# Patient Record
Sex: Male | Born: 1980 | Race: White | Hispanic: No | Marital: Married | Smoking: Current every day smoker
Health system: Southern US, Community
[De-identification: ages and names within clinical notes are randomized; demographics above are authoritative.]

## PROBLEM LIST (undated history)

## (undated) DIAGNOSIS — R569 Unspecified convulsions: Secondary | ICD-10-CM

## (undated) DIAGNOSIS — S065XAA Traumatic subdural hemorrhage with loss of consciousness status unknown, initial encounter: Secondary | ICD-10-CM

## (undated) DIAGNOSIS — S069XAA Unspecified intracranial injury with loss of consciousness status unknown, initial encounter: Secondary | ICD-10-CM

## (undated) DIAGNOSIS — S065X9A Traumatic subdural hemorrhage with loss of consciousness of unspecified duration, initial encounter: Secondary | ICD-10-CM

## (undated) DIAGNOSIS — S069X9A Unspecified intracranial injury with loss of consciousness of unspecified duration, initial encounter: Secondary | ICD-10-CM

## (undated) HISTORY — PX: BURR HOLE FOR SUBDURAL HEMATOMA: SHX1275

---

## 2018-10-27 ENCOUNTER — Emergency Department (HOSPITAL_COMMUNITY): Payer: No Typology Code available for payment source

## 2018-10-27 ENCOUNTER — Encounter (HOSPITAL_COMMUNITY): Payer: Self-pay | Admitting: Emergency Medicine

## 2018-10-27 ENCOUNTER — Other Ambulatory Visit: Payer: Self-pay

## 2018-10-27 ENCOUNTER — Inpatient Hospital Stay (HOSPITAL_COMMUNITY)
Admission: EM | Admit: 2018-10-27 | Discharge: 2018-10-29 | DRG: 101 | Disposition: A | Discharge status: Home / Self Care | Payer: No Typology Code available for payment source | Attending: Internal Medicine | Admitting: Internal Medicine

## 2018-10-27 DIAGNOSIS — Z8249 Family history of ischemic heart disease and other diseases of the circulatory system: Secondary | ICD-10-CM

## 2018-10-27 DIAGNOSIS — R569 Unspecified convulsions: Secondary | ICD-10-CM

## 2018-10-27 DIAGNOSIS — Z781 Physical restraint status: Secondary | ICD-10-CM

## 2018-10-27 DIAGNOSIS — F15950 Other stimulant use, unspecified with stimulant-induced psychotic disorder with delusions: Secondary | ICD-10-CM | POA: Diagnosis not present

## 2018-10-27 DIAGNOSIS — G40409 Other generalized epilepsy and epileptic syndromes, not intractable, without status epilepticus: Secondary | ICD-10-CM | POA: Diagnosis not present

## 2018-10-27 DIAGNOSIS — I10 Essential (primary) hypertension: Secondary | ICD-10-CM | POA: Diagnosis not present

## 2018-10-27 DIAGNOSIS — Z20828 Contact with and (suspected) exposure to other viral communicable diseases: Secondary | ICD-10-CM | POA: Diagnosis not present

## 2018-10-27 DIAGNOSIS — Z87820 Personal history of traumatic brain injury: Secondary | ICD-10-CM | POA: Diagnosis not present

## 2018-10-27 DIAGNOSIS — F1210 Cannabis abuse, uncomplicated: Secondary | ICD-10-CM | POA: Diagnosis not present

## 2018-10-27 DIAGNOSIS — Z885 Allergy status to narcotic agent status: Secondary | ICD-10-CM | POA: Diagnosis not present

## 2018-10-27 DIAGNOSIS — Z720 Tobacco use: Secondary | ICD-10-CM | POA: Diagnosis present

## 2018-10-27 DIAGNOSIS — F17210 Nicotine dependence, cigarettes, uncomplicated: Secondary | ICD-10-CM | POA: Diagnosis not present

## 2018-10-27 DIAGNOSIS — R059 Cough, unspecified: Secondary | ICD-10-CM

## 2018-10-27 DIAGNOSIS — R05 Cough: Secondary | ICD-10-CM

## 2018-10-27 DIAGNOSIS — Z880 Allergy status to penicillin: Secondary | ICD-10-CM

## 2018-10-27 HISTORY — DX: Traumatic subdural hemorrhage with loss of consciousness of unspecified duration, initial encounter: S06.5X9A

## 2018-10-27 HISTORY — DX: Unspecified convulsions: R56.9

## 2018-10-27 HISTORY — DX: Unspecified intracranial injury with loss of consciousness status unknown, initial encounter: S06.9XAA

## 2018-10-27 HISTORY — DX: Unspecified intracranial injury with loss of consciousness of unspecified duration, initial encounter: S06.9X9A

## 2018-10-27 HISTORY — DX: Traumatic subdural hemorrhage with loss of consciousness status unknown, initial encounter: S06.5XAA

## 2018-10-27 LAB — VITAMIN B12: Vitamin B-12: 246 pg/mL (ref 180–914)

## 2018-10-27 LAB — BASIC METABOLIC PANEL
Anion gap: 10 (ref 5–15)
BUN: 12 mg/dL (ref 6–20)
CO2: 23 mmol/L (ref 22–32)
Calcium: 9.1 mg/dL (ref 8.9–10.3)
Chloride: 105 mmol/L (ref 98–111)
Creatinine, Ser: 0.9 mg/dL (ref 0.61–1.24)
GFR calc Af Amer: >60 mL/min (ref >60)
GFR calc non Af Amer: >60 mL/min (ref >60)
Glucose, Bld: 84 mg/dL (ref 70–99)
Potassium: 3.9 mmol/L (ref 3.5–5.1)
Sodium: 138 mmol/L (ref 135–145)

## 2018-10-27 LAB — URINALYSIS, ROUTINE W REFLEX MICROSCOPIC
Bacteria, UA: NONE SEEN
Bilirubin Urine: NEGATIVE
Glucose, UA: NEGATIVE mg/dL
Ketones, ur: NEGATIVE mg/dL
Leukocytes,Ua: NEGATIVE
Nitrite: NEGATIVE
Protein, ur: NEGATIVE mg/dL
Specific Gravity, Urine: 1.006 (ref 1.005–1.030)
pH: 6 (ref 5.0–8.0)

## 2018-10-27 LAB — ETHANOL: Alcohol, Ethyl (B): <10 mg/dL (ref <10)

## 2018-10-27 LAB — CBC
HCT: 44.1 % (ref 39.0–52.0)
Hemoglobin: 14.9 g/dL (ref 13.0–17.0)
MCH: 29.8 pg (ref 26.0–34.0)
MCHC: 33.8 g/dL (ref 30.0–36.0)
MCV: 88.2 fL (ref 80.0–100.0)
Platelets: 210 10*3/uL (ref 150–400)
RBC: 5 MIL/uL (ref 4.22–5.81)
RDW: 11.9 % (ref 11.5–15.5)
WBC: 7.2 10*3/uL (ref 4.0–10.5)
nRBC: 0 % (ref 0.0–0.2)

## 2018-10-27 LAB — HEPATIC FUNCTION PANEL
ALT: 14 U/L (ref 0–44)
AST: 24 U/L (ref 15–41)
Albumin: 4.1 g/dL (ref 3.5–5.0)
Alkaline Phosphatase: 52 U/L (ref 38–126)
Bilirubin, Direct: 0.1 mg/dL (ref 0.0–0.2)
Indirect Bilirubin: 0.7 mg/dL (ref 0.3–0.9)
Total Bilirubin: 0.8 mg/dL (ref 0.3–1.2)
Total Protein: 7.3 g/dL (ref 6.5–8.1)

## 2018-10-27 LAB — VALPROIC ACID LEVEL: Valproic Acid Lvl: <10 ug/mL — ABNORMAL LOW (ref 50.0–100.0)

## 2018-10-27 LAB — TSH: TSH: 0.616 u[IU]/mL (ref 0.350–4.500)

## 2018-10-27 LAB — RAPID URINE DRUG SCREEN, HOSP PERFORMED
Amphetamines: POSITIVE — AB
Barbiturates: NOT DETECTED
Benzodiazepines: POSITIVE — AB
Cocaine: NOT DETECTED
Opiates: NOT DETECTED
Tetrahydrocannabinol: POSITIVE — AB

## 2018-10-27 LAB — SARS CORONAVIRUS 2 BY RT PCR (HOSPITAL ORDER, PERFORMED IN ~~LOC~~ HOSPITAL LAB): SARS Coronavirus 2: NEGATIVE

## 2018-10-27 LAB — CARBAMAZEPINE LEVEL, TOTAL: Carbamazepine Lvl: <2 ug/mL — ABNORMAL LOW (ref 4.0–12.0)

## 2018-10-27 LAB — AMMONIA: Ammonia: 51 umol/L — ABNORMAL HIGH (ref 9–35)

## 2018-10-27 LAB — PHENYTOIN LEVEL, TOTAL: Phenytoin Lvl: <2.5 ug/mL — ABNORMAL LOW (ref 10.0–20.0)

## 2018-10-27 LAB — SEDIMENTATION RATE: Sed Rate: 2 mm/hr (ref 0–16)

## 2018-10-27 MED ORDER — NICOTINE 21 MG/24HR TD PT24: 21.0000 mg | MEDICATED_PATCH | Freq: Every day | TRANSDERMAL | Status: DC | PRN

## 2018-10-27 MED ORDER — LORAZEPAM 2 MG/ML IJ SOLN: 1.0000 mg | Freq: Four times a day (QID) | INTRAMUSCULAR | Status: DC | PRN

## 2018-10-27 MED ORDER — SODIUM CHLORIDE 0.9 % IV SOLN
INTRAVENOUS | Status: DC
  Administered 2018-10-27: 20:00:00 via INTRAVENOUS

## 2018-10-27 MED ORDER — ACETAMINOPHEN 325 MG PO TABS: 650.0000 mg | ORAL_TABLET | Freq: Four times a day (QID) | ORAL | Status: DC | PRN

## 2018-10-27 MED ORDER — LEVETIRACETAM IN NACL 500 MG/100ML IV SOLN
500.0000 mg | Freq: Two times a day (BID) | INTRAVENOUS | Status: DC
  Administered 2018-10-28: 500 mg via INTRAVENOUS
  Filled 2018-10-27 (×6): qty 100

## 2018-10-27 MED ORDER — LEVETIRACETAM IN NACL 1000 MG/100ML IV SOLN
1000.0000 mg | Freq: Once | INTRAVENOUS | Status: AC
  Administered 2018-10-27: 1000 mg via INTRAVENOUS
  Filled 2018-10-27: qty 100

## 2018-10-27 MED ORDER — ACETAMINOPHEN 650 MG RE SUPP: 650.0000 mg | Freq: Four times a day (QID) | RECTAL | Status: DC | PRN

## 2018-10-27 NOTE — ED Provider Notes (Signed)
[facility] EMERGENCY DEPARTMENT Provider Note   CSN: [id number] Arrival date & time: 10/27/18  1402    History   Chief Complaint Chief Complaint  Patient presents with  . Seizures    HPI Nathan Mccarty is a 38 y.o. male.     Patient is a 38 year old male with past medical history of bilateral subdural hematomas years ago with subsequent seizure disorder.  He presents today for evaluation of seizure activity and unresponsiveness.  The patient was having his dog euthanized.  While he was at the veterinary office, he apparently experienced a 10-minute episode of generalized shaking felt to be a seizure.  He remains minimally responsive and postictal.  He adds no additional history secondary to unresponsiveness and acuity of condition.  The history is provided by the patient.  Seizures  Seizure type:  Grand mal Initial focality:  None Episode characteristics: confusion and disorientation   Severity:  Severe Duration:  10 minutes Timing:  Once Progression:  Partially resolved   Past Medical History:  Diagnosis Date  . Seizures (HCC)     There are no active problems to display for this patient.   History reviewed. No pertinent surgical history.      Home Medications    Prior to Admission medications   Not on File    Family History No family history on file.  Social History Social History   Tobacco Use  . Smoking status: Never Smoker  . Smokeless tobacco: Never Used  Substance Use Topics  . Alcohol use: Never    Frequency: Never  . Drug use: Never     Allergies   Patient has no allergy information on record.   Review of Systems Review of Systems  Unable to perform ROS: Acuity of condition  Neurological: Positive for seizures.     Physical Exam Updated Vital Signs BP (!) 143/108   Pulse 81   Temp 97.7 F (36.5 C) (Oral)   Resp 18   SpO2 100%   Physical Exam Vitals signs and nursing note reviewed.  Constitutional:      General: He  is not in acute distress.    Appearance: He is well-developed. He is not diaphoretic.  HENT:     Head: Normocephalic and atraumatic.  Neck:     Musculoskeletal: Normal range of motion and neck supple.  Cardiovascular:     Rate and Rhythm: Normal rate and regular rhythm.     Heart sounds: No murmur. No friction rub.  Pulmonary:     Effort: Pulmonary effort is normal. No respiratory distress.     Breath sounds: Normal breath sounds. No wheezing or rales.  Abdominal:     General: Bowel sounds are normal. There is no distension.     Palpations: Abdomen is soft.     Tenderness: There is no abdominal tenderness.  Musculoskeletal: Normal range of motion.  Skin:    General: Skin is warm and dry.  Neurological:     Mental Status: He is alert.     Coordination: Coordination normal.     Comments: Patient is somnolent, but will attempt to speak when stimulated with loud voice or noxious stimuli.  He then resumes somnolence.  I have witnessed to move all 4 extremities.  Pupils are 3 mm and slowly reactive with wandering eye movements.      ED Treatments / Results  Labs (all labs ordered are listed, but only abnormal results are displayed) Labs Reviewed  CBC  CARBAMAZEPINE LEVEL, TOTAL  PHENYTOIN LEVEL,  TOTAL  VALPROIC ACID LEVEL  BASIC METABOLIC PANEL    EKG None  Radiology No results found.  Procedures Procedures (including critical care time)  Medications Ordered in ED Medications - No data to display   Initial Impression / Assessment and Plan / ED Course  I have reviewed the triage vital signs and the nursing notes.  Pertinent labs & imaging results that were available during my care of the patient were reviewed by me and considered in my medical decision making (see chart for details).  Patient brought here by EMS after an apparent seizure.  I was told it lasted approximately 10 minutes and patient seems to have a prolonged postictal phase.  His CT scan is unremarkable  and laboratory studies are also unremarkable.  Patient will be admitted to the hospitalist service for observation.  He was given IV Keppra here in the ER.  Final Clinical Impressions(s) / ED Diagnoses   Final diagnoses:  None    ED Discharge Orders    None       Geoffery Lyons, MD 10/27/18 2258

## 2018-10-27 NOTE — ED Notes (Signed)
From CT 

## 2018-10-27 NOTE — ED Notes (Signed)
Call for report 

## 2018-10-27 NOTE — ED Notes (Signed)
Spoke w Dr Judd Lien re pt and conversation with wife

## 2018-10-27 NOTE — H&P (Signed)
TRH H&P    Patient Demographics:    Nathan Mccarty, is a 38 y.o. male  MRN: [id number]  DOB - 03/28/1981  Admit Date - 10/27/2018  Referring MD/NP/PA: Geoffery Lyonsouglas Delo  [facility]patient [facility] as primary care and neurology  Patient coming from: veterinarian office  Chief complaint- seizure   HPI:    Nathan GrosDaniel Mccarty  is a 38 y.o. male,h/o subdural hematoma x2 (nontraumatic ?), seizure do, TBI while in [employer], apparently presents due to generalized seizure.  Per his wife, they had taken his dog to the veterinarian and had witnessed generalized tonic clonic seizure x 10 minutes.  + incontinence.  + postictal state.  Pt has been on dilantin in the past with adverse reaction of hallucination, and also previously on keppra with good results.  Pt's last seizure about 1 year ago according to his wife. Pt is currently not taking any medication.  He states that he is using marijuana to treat his seizures.   In ED,  Pt initially postictal.  Currently axox2 (person, place, but not year),  Pt is improving per nursing staff.  He wasn't even talking before.   + confirmed incontinence.   T 97.7  P 81  R 17  Bp 146/108  Pox 100%  CT brain  IMPRESSION: 1. No acute intracranial abnormality. 2. Resolved right occipital blood since March, negative noncontrast CT appearance of the brain today aside from prior bilateral craniotomies.  Wbc 7.2, hgb 14.9, Plt 210 Na 138, K 3.9 Bun 12, Creatinine 0.9 Glucose 84  Pt given keppra 500mg  iv x1 while in ED, no further witnessed seizures.   Pt will be admitted for seizure, AMS secondary to post ictal state, improving.         Review of systems:    In addition to the HPI above,  No Fever-chills, No Headache, No changes with Vision or hearing, No problems swallowing food or Liquids, No Chest pain, Cough or  Shortness of Breath, No Abdominal pain, No Nausea or Vomiting, bowel movements are regular, No Blood in stool or Urine, No dysuria, No new skin rashes or bruises, No new joints pains-aches,  No new weakness, tingling, numbness in any extremity, No recent weight gain or loss, No polyuria, polydypsia or polyphagia, No significant Mental Stressors.  All other systems reviewed and are negative.    Past History of the following :    Past Medical History:  Diagnosis Date  . Seizures (HCC)   . Subdural hematoma (HCC)   . TBI (traumatic brain injury) [facility](HCC)       Past Surgical History:  Procedure Laterality Date  . BURR HOLE FOR SUBDURAL HEMATOMA        Social History:      Social History   Tobacco Use  . Smoking status: Current Every Day Smoker    Types: Cigarettes  . Smokeless tobacco: Never Used  Substance Use Topics  . Alcohol use: Never    Frequency: Never  Family History :     Family History  Problem Relation Age of Onset  . CAD Father        Home Medications:   Prior to Admission medications   Medication Sig Start Date End Date Taking? Authorizing Provider  gabapentin (NEURONTIN) 300 MG capsule Take 300 mg by mouth 3 (three) times daily.   Yes [provider]     Allergies:     Allergies  Allergen Reactions  . Dilantin [Phenytoin Sodium Extended] Other (See Comments)    Hallucination per wife  . Penicillins     Did it involve swelling of the face/tongue/throat, SOB, or low BP? No Did it involve sudden or severe rash/hives, skin peeling, or any reaction on the inside of your mouth or nose? No Did you need to seek medical attention at a hospital or doctor's office? No When did it last happen? If all above answers are "NO", may proceed with cephalosporin use.   . Topamax [Topiramate]     hallucinations  . Toradol [Ketorolac Tromethamine] Other (See Comments)     Physical Exam:   Vitals  Blood pressure (!) 156/90,  pulse 81, temperature 97.7 F (36.5 C), temperature source Oral, resp. rate 18, SpO2 100 %.  1.  General: axox 2 (person, place)  2. Psychiatric: euthymic  3. Neurologic: Slightly somnolent, cn2-12 intact, reflexes 2+ symmetric, diffuse with no clonus, motor 5/5 in all 4 ext  4. HEENMT:  Anicteric, pupils, 1.57mm symmetric, direct, consensual, near intact No tongue laceration Neck: no jvd, no bruit,   5. Respiratory : CTAB  6. Cardiovascular : rrr s1, s2, no m/g/r  7. Gastrointestinal:  Abd: soft, nt, nd, +bs  8. Skin:  Ext: no c/c/e,  No rash  9.Musculoskeletal:  Good ROM  No adenopathy    Data Review:    CBC Recent Labs  Lab 10/27/18 1510  WBC 7.2  HGB 14.9  HCT 44.1  PLT 210  MCV 88.2  MCH 29.8  MCHC 33.8  RDW 11.9   ------------------------------------------------------------------------------------------------------------------  Results for orders placed or performed during the hospital encounter of 10/27/18 (from the past 48 hour(s))  Carbamazepine (Tegretol) Level (if patient is taking this medication)     Status: Abnormal   Collection Time: 10/27/18  3:10 PM  Result Value Ref Range   Carbamazepine Lvl <2.0 (L) 4.0 - 12.0 ug/mL    Comment: Performed at [facility], [address], [place], [place] [address]  Dilantin (phenytoin) Level (if patient is taking this medication)     Status: Abnormal   Collection Time: 10/27/18  3:10 PM  Result Value Ref Range   Phenytoin Lvl <2.5 (L) 10.0 - 20.0 ug/mL    Comment: Performed at [facility], [address], [place], [place] [address]  Valproic Acid (depakote) Level (if patient is taking this medication)     Status: Abnormal   Collection Time: 10/27/18  3:10 PM  Result Value Ref Range   Valproic Acid Lvl <10 (L) 50.0 - 100.0 ug/mL    Comment: RESULTS CONFIRMED BY MANUAL DILUTION Performed at [facility], [address] [place], [place] [address]   CBC - if new onset seizures     Status: None    Collection Time: 10/27/18  3:10 PM  Result Value Ref Range   WBC 7.2 4.0 - 10.5 K/uL   RBC 5.00 4.22 - 5.81 MIL/uL   Hemoglobin 14.9 13.0 - 17.0 g/dL   HCT 13.0 86.5 - 78.4 %   MCV 88.2  80.0 - 100.0 fL   MCH 29.8 26.0 - 34.0 pg   MCHC 33.8 30.0 - 36.0 g/dL   RDW 82.9 56.2 - 13.0 %   Platelets 210 150 - 400 K/uL   nRBC 0.0 0.0 - 0.2 %    Comment: Performed at [facility], [address] [place], [place] [address]  Basic metabolic panel - if new onset seizures     Status: None   Collection Time: 10/27/18  3:10 PM  Result Value Ref Range   Sodium 138 135 - 145 mmol/L   Potassium 3.9 3.5 - 5.1 mmol/L   Chloride 105 98 - 111 mmol/L   CO2 23 22 - 32 mmol/L   Glucose, Bld 84 70 - 99 mg/dL   BUN 12 6 - 20 mg/dL   Creatinine, Ser 4.69 0.61 - 1.24 mg/dL   Calcium 9.1 8.9 - 62.9 mg/dL   GFR calc non Af Amer >60 >60 mL/min   GFR calc Af Amer >60 >60 mL/min   Anion gap 10 5 - 15    Comment: Performed at [facility], [address], [place], [place] [address]  Urinalysis, Routine w reflex microscopic     Status: Abnormal   Collection Time: 10/27/18  3:48 PM  Result Value Ref Range   Color, Urine YELLOW YELLOW   APPearance CLEAR CLEAR   Specific Gravity, Urine 1.006 1.005 - 1.030   pH 6.0 5.0 - 8.0   Glucose, UA NEGATIVE NEGATIVE mg/dL   Hgb urine dipstick SMALL (A) NEGATIVE   Bilirubin Urine NEGATIVE NEGATIVE   Ketones, ur NEGATIVE NEGATIVE mg/dL   Protein, ur NEGATIVE NEGATIVE mg/dL   Nitrite NEGATIVE NEGATIVE   Leukocytes,Ua NEGATIVE NEGATIVE   RBC / HPF 0-5 0 - 5 RBC/hpf   WBC, UA 0-5 0 - 5 WBC/hpf   Bacteria, UA NONE SEEN NONE SEEN    Comment: Performed at [facility], [address] [place], [place] [address]  Urine rapid drug screen (hosp performed)     Status: Abnormal   Collection Time: 10/27/18  3:48 PM  Result Value Ref Range   Opiates NONE DETECTED NONE DETECTED   Cocaine NONE DETECTED NONE DETECTED   Benzodiazepines POSITIVE (A) NONE DETECTED   Amphetamines  POSITIVE (A) NONE DETECTED   Tetrahydrocannabinol POSITIVE (A) NONE DETECTED   Barbiturates NONE DETECTED NONE DETECTED    Comment: (NOTE) DRUG SCREEN FOR MEDICAL PURPOSES ONLY.  IF CONFIRMATION IS NEEDED FOR ANY PURPOSE, NOTIFY LAB WITHIN 5 DAYS. LOWEST DETECTABLE LIMITS FOR URINE DRUG SCREEN Drug Class                     Cutoff (ng/mL) Amphetamine and metabolites    1000 Barbiturate and metabolites    200 Benzodiazepine                 200 Tricyclics and metabolites     300 Opiates and metabolites        300 Cocaine and metabolites        300 THC                            50 Performed at [facility], [address], [place], [place] [address]   Ethanol     Status: None   Collection Time: 10/27/18  3:49 PM  Result Value Ref Range   Alcohol, Ethyl (B) <10 <10 mg/dL    Comment: (NOTE) Lowest detectable limit for serum  alcohol is 10 mg/dL. For medical purposes only. Performed at [facility], [address], [place], [place] [address]   SARS Coronavirus 2 (CEPHEID - Performed in [facility] hospital lab), Hosp Order     Status: None   Collection Time: 10/27/18  7:02 PM  Result Value Ref Range   SARS Coronavirus 2 NEGATIVE NEGATIVE    Comment: (NOTE) If result is NEGATIVE SARS-CoV-2 target nucleic acids are NOT DETECTED. The SARS-CoV-2 RNA is generally detectable in upper and lower  respiratory specimens during the acute phase of infection. The lowest  concentration of SARS-CoV-2 viral copies this assay can detect is 250  copies / mL. A negative result does not preclude SARS-CoV-2 infection  and should not be used as the sole basis for treatment or other  patient management decisions.  A negative result may occur with  improper specimen collection / handling, submission of specimen other  than nasopharyngeal swab, presence of viral mutation(s) within the  areas targeted by this assay, and inadequate number of viral copies  (<250 copies / mL). A negative result must be  combined with clinical  observations, patient history, and epidemiological information. If result is POSITIVE SARS-CoV-2 target nucleic acids are DETECTED. The SARS-CoV-2 RNA is generally detectable in upper and lower  respiratory specimens dur ing the acute phase of infection.  Positive  results are indicative of active infection with SARS-CoV-2.  Clinical  correlation with patient history and other diagnostic information is  necessary to determine patient infection status.  Positive results do  not rule out bacterial infection or co-infection with other viruses. If result is PRESUMPTIVE POSTIVE SARS-CoV-2 nucleic acids MAY BE PRESENT.   A presumptive positive result was obtained on the submitted specimen  and confirmed on repeat testing.  While 2019 novel coronavirus  (SARS-CoV-2) nucleic acids may be present in the submitted sample  additional confirmatory testing may be necessary for epidemiological  and / or clinical management purposes  to differentiate between  SARS-CoV-2 and other Sarbecovirus currently known to infect humans.  If clinically indicated additional testing with an alternate test  methodology [phone]) is advised. The SARS-CoV-2 RNA is generally  detectable in upper and lower respiratory sp ecimens during the acute  phase of infection. The expected result is Negative. Fact Sheet for Patients:  BoilerBrush.com.cy Fact Sheet for Healthcare Providers: https://pope.com/ This test is not yet approved or cleared by the [place] FDA and has been authorized for detection and/or diagnosis of SARS-CoV-2 by FDA under an Emergency Use Authorization (EUA).  This EUA will remain in effect (meaning this test can be used) for the duration of the COVID-19 declaration under Section 564(b)(1) of the Act, 21 U.S.C. section 360bbb-3(b)(1), unless the authorization is terminated or revoked sooner. Performed at [facility],  [address] [place], [place] [address]     Chemistries  Recent Labs  Lab 10/27/18 1510  NA 138  K 3.9  CL 105  CO2 23  GLUCOSE 84  BUN 12  CREATININE 0.90  CALCIUM 9.1   ------------------------------------------------------------------------------------------------------------------  ------------------------------------------------------------------------------------------------------------------ GFR: CrCl cannot be calculated (Unknown ideal weight.). Liver Function Tests: No results for input(s): AST, ALT, ALKPHOS, BILITOT, PROT, ALBUMIN in the last 168 hours. No results for input(s): LIPASE, AMYLASE in the last 168 hours. No results for input(s): AMMONIA in the last 168 hours. Coagulation Profile: No results for input(s): INR, PROTIME in the last 168 hours. Cardiac Enzymes: No results for input(s): CKTOTAL, CKMB, CKMBINDEX, TROPONINI in the last 168 hours. BNP (  last 3 results) No results for input(s): PROBNP in the last 8760 hours. HbA1C: No results for input(s): HGBA1C in the last 72 hours. CBG: No results for input(s): GLUCAP in the last 168 hours. Lipid Profile: No results for input(s): CHOL, HDL, LDLCALC, TRIG, CHOLHDL, LDLDIRECT in the last 72 hours. Thyroid Function Tests: No results for input(s): TSH, T4TOTAL, FREET4, T3FREE, THYROIDAB in the last 72 hours. Anemia Panel: No results for input(s): VITAMINB12, FOLATE, FERRITIN, TIBC, IRON, RETICCTPCT in the last 72 hours.  --------------------------------------------------------------------------------------------------------------- Urine analysis:    Component Value Date/Time   COLORURINE YELLOW 10/27/2018 1548   APPEARANCEUR CLEAR 10/27/2018 1548   LABSPEC 1.006 10/27/2018 1548   PHURINE 6.0 10/27/2018 1548   GLUCOSEU NEGATIVE 10/27/2018 1548   HGBUR SMALL (A) 10/27/2018 1548   BILIRUBINUR NEGATIVE 10/27/2018 1548   KETONESUR NEGATIVE 10/27/2018 1548   PROTEINUR NEGATIVE 10/27/2018 1548   NITRITE NEGATIVE  10/27/2018 1548   LEUKOCYTESUR NEGATIVE 10/27/2018 1548      Imaging Results:    Ct Head Wo Contrast  Result Date: 10/27/2018 CLINICAL DATA:  38 year old male with seizure. History of intracranial hemorrhage in March. EXAM: CT HEAD WITHOUT CONTRAST TECHNIQUE: Contiguous axial images were obtained from the base of the skull through the vertex without intravenous contrast. COMPARISON:  [facility] head CT without contrast 08/07/2018 FINDINGS: Brain: No acute intracranial hemorrhage identified today. No residual right occipital convexity blood identified. Stable cerebral volume. No ventriculomegaly. No midline shift, mass effect, or evidence of intracranial mass lesion. Gray-white matter differentiation is stable and within normal limits throughout the brain. No cortically based acute infarct identified. 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 Vascular: No suspicious intracranial vascular hyperdensity. Skull: Previous bilateral superior convexity craniotomies. No acute osseous abnormality identified. Sinuses/Orbits: Visualized paranasal sinuses and mastoids are stable and well pneumatized. Other: Negative orbits. Chronic postoperative changes to the scalp. IMPRESSION: 1. No acute intracranial abnormality. 2. Resolved right occipital blood since March, negative noncontrast CT appearance of the brain today aside from prior bilateral craniotomies. Electronically Signed   By: Odessa Fleming M.D.   On: 10/27/2018 15:58       Assessment & Plan:    Principal Problem:   Seizure (HCC) Active Problems:   Tobacco abuse  Seizure, AMS secondary to postictal state vs marijuana use NPO til more awake MRI brain EEG Keppra  iv bid Ativan  iv q6h prn seizure Please consult neurology in AM  Hypertension Start Amlodipine 2.5mg  po qday  Tobacco abuse Nicotine patch  topically qday prn Counselled on smoking cessation x 3 minutes  Marijuana abuse, UDS also + for barbituates  and amphetamines counselled on cessation  DVT Prophylaxis-   - SCDs   AM Labs Ordered, also please review Full Orders  Family Communication: Admission, patients condition and plan of care including tests being ordered have been discussed with the patient and patient's wife who indicate understanding and agree with the plan and Code Status.  D/w wife that we will transfer to [facility] as no neurology service over the weekend at [facility]  Code Status:  FULL CODE  Admission status: Observation: Based on patients clinical presentation and evaluation of above clinical data, I have made determination that patient meets observation criteria at this time.   Time spent in minutes :  55 minutes   Pearson Grippe M.D on 10/27/2018 at 8:34 PM

## 2018-10-27 NOTE — ED Notes (Signed)
Pt remains somnolent  Attempt to call spouse insucessful

## 2018-10-27 NOTE — ED Notes (Signed)
Report to [place], [employer]

## 2018-10-27 NOTE — ED Notes (Signed)
Call to spouse with update

## 2018-10-27 NOTE — ED Notes (Signed)
Call to CT   Pt needs to go next due to med hx

## 2018-10-27 NOTE — ED Notes (Signed)
Dr Selena Batten speaking with pt spouse   Nadara Eaton [phone]

## 2018-10-27 NOTE — ED Notes (Signed)
Dr Judd Lien in to reassess

## 2018-10-27 NOTE — ED Notes (Signed)
Permission to transfer by spouse to Dr Selena Batten and this RN

## 2018-10-27 NOTE — ED Triage Notes (Signed)
Pt had a seizure.  2.5 of versed given by EMS.  10 seizure witnessed. Pt alert to voice.

## 2018-10-27 NOTE — ED Notes (Signed)
Spoke with wife Larita Fife: [phone]  She reports pt had bilateral subdurals in 2009  Another 3/15 was seen at [facility] for same   Had seizures last a year ago- today had to take his dog for euthanasia Seizure there for more or less 10 minutes

## 2018-10-27 NOTE — ED Notes (Signed)
In and out cath   Spec to lab

## 2018-10-27 NOTE — ED Notes (Signed)
Report to [place], RN [facility]

## 2018-10-27 NOTE — ED Notes (Signed)
Call for report  Reluctant to take report as they have awaited a patient for many hours   Will call when pt is ready to leave

## 2018-10-27 NOTE — ED Notes (Signed)
To CT

## 2018-10-28 ENCOUNTER — Observation Stay (HOSPITAL_COMMUNITY): Payer: No Typology Code available for payment source

## 2018-10-28 ENCOUNTER — Encounter (HOSPITAL_COMMUNITY): Payer: Self-pay | Admitting: *Deleted

## 2018-10-28 DIAGNOSIS — F600 Paranoid personality disorder: Secondary | ICD-10-CM | POA: Diagnosis not present

## 2018-10-28 DIAGNOSIS — F17210 Nicotine dependence, cigarettes, uncomplicated: Secondary | ICD-10-CM | POA: Diagnosis present

## 2018-10-28 DIAGNOSIS — F15950 Other stimulant use, unspecified with stimulant-induced psychotic disorder with delusions: Secondary | ICD-10-CM | POA: Diagnosis present

## 2018-10-28 DIAGNOSIS — F1210 Cannabis abuse, uncomplicated: Secondary | ICD-10-CM | POA: Diagnosis present

## 2018-10-28 DIAGNOSIS — F1510 Other stimulant abuse, uncomplicated: Secondary | ICD-10-CM | POA: Diagnosis not present

## 2018-10-28 DIAGNOSIS — R569 Unspecified convulsions: Secondary | ICD-10-CM | POA: Diagnosis present

## 2018-10-28 DIAGNOSIS — I10 Essential (primary) hypertension: Secondary | ICD-10-CM | POA: Diagnosis present

## 2018-10-28 DIAGNOSIS — R4689 Other symptoms and signs involving appearance and behavior: Secondary | ICD-10-CM | POA: Diagnosis not present

## 2018-10-28 DIAGNOSIS — F15988 Other stimulant use, unspecified with other stimulant-induced disorder: Secondary | ICD-10-CM | POA: Diagnosis not present

## 2018-10-28 DIAGNOSIS — Z781 Physical restraint status: Secondary | ICD-10-CM | POA: Diagnosis not present

## 2018-10-28 DIAGNOSIS — Z8249 Family history of ischemic heart disease and other diseases of the circulatory system: Secondary | ICD-10-CM | POA: Diagnosis not present

## 2018-10-28 DIAGNOSIS — Z20828 Contact with and (suspected) exposure to other viral communicable diseases: Secondary | ICD-10-CM | POA: Diagnosis present

## 2018-10-28 DIAGNOSIS — F062 Psychotic disorder with delusions due to known physiological condition: Secondary | ICD-10-CM | POA: Diagnosis not present

## 2018-10-28 DIAGNOSIS — Z87820 Personal history of traumatic brain injury: Secondary | ICD-10-CM | POA: Diagnosis not present

## 2018-10-28 DIAGNOSIS — Z885 Allergy status to narcotic agent status: Secondary | ICD-10-CM | POA: Diagnosis not present

## 2018-10-28 DIAGNOSIS — F22 Delusional disorders: Secondary | ICD-10-CM | POA: Diagnosis not present

## 2018-10-28 DIAGNOSIS — Z880 Allergy status to penicillin: Secondary | ICD-10-CM | POA: Diagnosis not present

## 2018-10-28 DIAGNOSIS — G40409 Other generalized epilepsy and epileptic syndromes, not intractable, without status epilepticus: Secondary | ICD-10-CM | POA: Diagnosis present

## 2018-10-28 MED ORDER — ZIPRASIDONE MESYLATE 20 MG IM SOLR
20.0000 mg | Freq: Once | INTRAMUSCULAR | Status: AC
  Administered 2018-10-28: 20 mg via INTRAMUSCULAR
  Filled 2018-10-28: qty 20

## 2018-10-28 MED ORDER — LORAZEPAM 2 MG/ML IJ SOLN: 1.0000 mg | Freq: Four times a day (QID) | INTRAMUSCULAR | Status: DC | PRN

## 2018-10-28 MED ORDER — LORAZEPAM 2 MG/ML IJ SOLN
2.0000 mg | INTRAMUSCULAR | Status: DC | PRN
  Administered 2018-10-28: 2 mg via INTRAMUSCULAR
  Filled 2018-10-28: qty 1

## 2018-10-28 MED ORDER — DIPHENHYDRAMINE HCL 50 MG/ML IJ SOLN
50.0000 mg | Freq: Four times a day (QID) | INTRAMUSCULAR | Status: DC | PRN
  Administered 2018-10-28 (×2): 50 mg via INTRAMUSCULAR
  Filled 2018-10-28 (×2): qty 1

## 2018-10-28 MED ORDER — LORAZEPAM 2 MG/ML IJ SOLN
2.0000 mg | Freq: Four times a day (QID) | INTRAMUSCULAR | Status: DC | PRN
  Administered 2018-10-28: 2 mg via INTRAMUSCULAR
  Filled 2018-10-28: qty 1

## 2018-10-28 MED ORDER — DIPHENHYDRAMINE HCL 50 MG/ML IJ SOLN
INTRAMUSCULAR | Status: AC
  Administered 2018-10-28: 50 mg
  Filled 2018-10-28: qty 1

## 2018-10-28 MED ORDER — SODIUM CHLORIDE 0.9 % IV SOLN
200.0000 mg | Freq: Once | INTRAVENOUS | Status: AC
  Administered 2018-10-28: 200 mg via INTRAVENOUS
  Filled 2018-10-28: qty 20

## 2018-10-28 MED ORDER — FENTANYL CITRATE (PF) 100 MCG/2ML IJ SOLN: 25.0000 ug | INTRAMUSCULAR | Status: DC | PRN

## 2018-10-28 MED ORDER — HALOPERIDOL LACTATE 5 MG/ML IJ SOLN
10.0000 mg | Freq: Four times a day (QID) | INTRAMUSCULAR | Status: DC | PRN
  Administered 2018-10-28 – 2018-10-29 (×3): 10 mg via INTRAMUSCULAR
  Filled 2018-10-28 (×2): qty 2

## 2018-10-28 MED ORDER — SODIUM CHLORIDE 0.9 % IV SOLN
100.0000 mg | Freq: Two times a day (BID) | INTRAVENOUS | Status: DC
  Administered 2018-10-28: 100 mg via INTRAVENOUS
  Filled 2018-10-28 (×3): qty 10

## 2018-10-28 MED ORDER — HALOPERIDOL LACTATE 5 MG/ML IJ SOLN
5.0000 mg | Freq: Four times a day (QID) | INTRAMUSCULAR | Status: DC | PRN
  Administered 2018-10-28: 5 mg via INTRAMUSCULAR
  Filled 2018-10-28 (×2): qty 1

## 2018-10-28 MED ORDER — HALOPERIDOL 5 MG PO TABS
5.0000 mg | ORAL_TABLET | Freq: Four times a day (QID) | ORAL | Status: DC | PRN
  Filled 2018-10-28: qty 1

## 2018-10-28 MED ORDER — FENTANYL CITRATE (PF) 100 MCG/2ML IJ SOLN
25.0000 ug | INTRAMUSCULAR | Status: DC | PRN
  Administered 2018-10-28 – 2018-10-29 (×4): 50 ug via INTRAMUSCULAR
  Filled 2018-10-28 (×4): qty 2
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
## 2018-10-28 NOTE — Progress Notes (Signed)
Due to pt not being AOx4, I spoke with pts wife Nathan Mccarty on the phone regarding pts past surgical history, he has had brain surgery for subdural hematoma and they did not place any clips or coils according to wife, also cleared by head CT yesterday. Wife states no foreign bodies or metal in pts body that would keep him from having his MRI.
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
## 2018-10-28 NOTE — Progress Notes (Signed)
Pt refusing EEG at this time. Notified Dr Lorraine Lax.

## 2018-10-28 NOTE — Plan of Care (Signed)
Progressing towards goals

## 2018-10-28 NOTE — Progress Notes (Signed)
CSW went ahead and completed the IVC for 7 days. CSW called and requested [place] [employer] or [facility] to come serve the patient.   The original IVC is placed on the patient's chart. 3 copies will need to be made in addition to the original and placed on the patient's chart.   It is unknown what time law enforcement will come and serve the patient.   Domenic Schwab, MSW, [place]

## 2018-10-28 NOTE — Progress Notes (Signed)
Witnessed seizure while in  MRI scanner started around 10:42am  lasting about 1.5 mins. Rapid called and responded and took pt back to his room
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
## 2018-10-28 NOTE — Consult Note (Signed)
Patient unable to cooperate with psychiatric evaluation at this time due to being lethagic. Re-consult psych when patient is alert and cooperative.  Corena Pilgrim, MD Attending psychiatrist

## 2018-10-29 ENCOUNTER — Other Ambulatory Visit: Payer: Self-pay

## 2018-10-29 DIAGNOSIS — F15950 Other stimulant use, unspecified with stimulant-induced psychotic disorder with delusions: Secondary | ICD-10-CM

## 2018-10-29 DIAGNOSIS — F22 Delusional disorders: Secondary | ICD-10-CM

## 2018-10-29 DIAGNOSIS — F15988 Other stimulant use, unspecified with other stimulant-induced disorder: Secondary | ICD-10-CM

## 2018-10-29 DIAGNOSIS — F062 Psychotic disorder with delusions due to known physiological condition: Secondary | ICD-10-CM

## 2018-10-29 DIAGNOSIS — F600 Paranoid personality disorder: Secondary | ICD-10-CM

## 2018-10-29 LAB — HIV ANTIBODY (ROUTINE TESTING W REFLEX): HIV Screen 4th Generation wRfx: NONREACTIVE

## 2018-10-29 MED ORDER — LACOSAMIDE 100 MG PO TABS: 100.0000 mg | ORAL_TABLET | Freq: Two times a day (BID) | ORAL | 0 refills | Status: AC

## 2018-10-29 MED ORDER — DIPHENHYDRAMINE HCL 50 MG/ML IJ SOLN: 50.0000 mg | Freq: Four times a day (QID) | INTRAMUSCULAR | Status: DC | PRN

## 2018-10-29 MED ORDER — FENTANYL CITRATE (PF) 100 MCG/2ML IJ SOLN: 25.0000 ug | INTRAMUSCULAR | Status: DC | PRN

## 2018-10-29 MED ORDER — HALOPERIDOL LACTATE 5 MG/ML IJ SOLN: 5.0000 mg | Freq: Four times a day (QID) | INTRAMUSCULAR | Status: DC | PRN

## 2018-10-29 MED ORDER — LACOSAMIDE 50 MG PO TABS: 100.0000 mg | ORAL_TABLET | Freq: Two times a day (BID) | ORAL | Status: DC

## 2018-10-29 MED ORDER — ONDANSETRON HCL 4 MG/2ML IJ SOLN
4.0000 mg | Freq: Four times a day (QID) | INTRAMUSCULAR | Status: DC | PRN
  Administered 2018-10-29: 15:00:00 4 mg via INTRAVENOUS
  Filled 2018-10-29: qty 2

## 2018-10-29 MED ORDER — DIPHENHYDRAMINE HCL 50 MG/ML IJ SOLN: 25.0000 mg | Freq: Four times a day (QID) | INTRAMUSCULAR | Status: DC | PRN

## 2018-10-29 MED ORDER — LORAZEPAM 2 MG/ML IJ SOLN: 1.0000 mg | Freq: Four times a day (QID) | INTRAMUSCULAR | Status: DC | PRN

## 2018-10-29 NOTE — Progress Notes (Addendum)
Reason for consult: Seizure  Subjective: Patient appears calm today. No further seizures.    ROS: negative except above  Examination  Vital signs in last 24 hours: Temp:  [97.6 F (36.4 C)-98.6 F (37 C)] 98.6 F (37 C) (06/07 1139) Pulse Rate:  [75-105] 101 (06/07 1139) Resp:  [19-20] 19 (06/07 1139) BP: (119-154)/(69-97) 120/97 (06/07 1139) SpO2:  [97 %-99 %] 98 % (06/07 1139) Weight:  [105.5 kg] 105.5 kg (06/07 0304)  General: lying in bed CVS: pulse-normal rate and rhythm RS: breathing comfortably Extremities: normal   Neuro: MS: Alert, oriented, follows commands CN: pupils equal and reactive,  EOMI, face symmetric, tongue midline, normal sensation over face, Motor: 5/5 strength in all 4 extremities Reflexes: 2+ bilaterally over patella, biceps, plantars: flexor Coordination: normal Gait: not tested  Basic Metabolic Panel: Recent Labs  Lab 10/27/18 1510  NA 138  K 3.9  CL 105  CO2 23  GLUCOSE 84  BUN 12  CREATININE 0.90  CALCIUM 9.1    CBC: Recent Labs  Lab 10/27/18 1510  WBC 7.2  HGB 14.9  HCT 44.1  MCV 88.2  PLT 210     Coagulation Studies: No results for input(s): LABPROT, INR in the last 72 hours.  Imaging Reviewed: MR brain : no acute findings    ASSESSMENT AND PLAN  Breakthrough seizure  Continue Vimpat 100mg  BID Avoid adderall, illicit drugs and marijuana F/U with Pscyh recommendations for agitation   Per [facility] statutes, patients with seizures are not allowed to drive until they have been seizure-free for six months. Use caution when using heavy equipment or power tools. Avoid working on ladders or at heights. Take showers instead of baths. Ensure the water temperature is not too high on the home water heater. Do not go swimming alone. Do not lock yourself in a room alone (i.e. bathroom). When caring for infants or small children, sit down when holding, feeding, or changing them to minimize risk of injury to the child  in the event you have a seizure. Maintain good sleep hygiene. Avoid alcohol.    If Nathan Mccarty has another seizure, call 911 and bring them back to the ED if:       A.  The seizure lasts longer than 5 minutes.            B.  The patient doesn't wake shortly after the seizure or has new problems such as difficulty seeing, speaking or moving following the seizure       C.  The patient was injured during the seizure       D.  The patient has a temperature over 102 F (39C)       E.  The patient vomited during the seizure and now is having trouble breathing  Attempted to contact patients wife. Unable to reach her.   Karena Addison Marina Desire [facility] Pager Number [phone] For questions after 7pm please refer to AMION to reach the Neurologist on call

## 2018-10-29 NOTE — TOC Initial Note (Signed)
Transition of Care [facility]) - Initial/Assessment Note    Patient Details  Name: Nathan Mccarty MRN: [id number] Date of Birth: Dec 27, 1980  [facility]) CM/SW Contact:    Pollie Friar, RN Phone Number: 10/29/2018, 4:47 PM  Clinical Narrative:                   Expected Discharge Plan: Home/Self Care Barriers to Discharge: No Barriers Identified   Patient Goals and CMS Choice        Expected Discharge Plan and Services Expected Discharge Plan: Home/Self Care   Discharge Planning Services: CM Consult     Expected Discharge Date: 10/29/18                                    Prior Living Arrangements/Services   Lives with:: Spouse                   Activities of Daily Living Home Assistive Devices/Equipment: None ADL Screening (condition at time of admission) Patient's cognitive ability adequate to safely complete daily activities?: Yes Is the patient deaf or have difficulty hearing?: No Does the patient have difficulty seeing, even when wearing glasses/contacts?: No Does the patient have difficulty concentrating, remembering, or making decisions?: No Patient able to express need for assistance with ADLs?: Yes Does the patient have difficulty dressing or bathing?: No Independently performs ADLs?: Yes (appropriate for developmental age) Does the patient have difficulty walking or climbing stairs?: No Weakness of Legs: None Weakness of Arms/Hands: None  Permission Sought/Granted                  Emotional Assessment Appearance:: Appears stated age   Affect (typically observed): Accepting Orientation: : Oriented to Self, Oriented to Place, Oriented to  Time   Psych Involvement: No (comment)  Admission diagnosis:  Seizure [facility]) [R56.9] Patient Active Problem List   Diagnosis Date Noted  . Amphetamine and psychostimulant-induced psychosis with delusions ([place]) 10/29/2018  . Seizure ([place]) 10/27/2018  . Tobacco abuse 10/27/2018    PCP:  Clinic, [place]:   [facility] [place], [address] AT [address] [place] [address] Phone: [phone] Fax: [phone]     Social Determinants of Health (SDOH) Interventions    Readmission Risk Interventions No flowsheet data found.

## 2018-10-29 NOTE — Plan of Care (Signed)
Progressing towards goals

## 2018-10-29 NOTE — Discharge Instructions (Signed)
Per [place] DMV statutes, patients with seizures are not allowed to drive until they have been seizure-free for six months.    Use caution when using heavy equipment or power tools. Avoid working on ladders or at heights. Take showers instead of baths. Ensure the water temperature is not too high on the home water heater. Do not go swimming alone. Do not lock yourself in a room alone (i.e. bathroom). When caring for infants or small children, sit down when holding, feeding, or changing them to minimize risk of injury to the child in the event you have a seizure. Maintain good sleep hygiene. Avoid alcohol.    If patient has another seizure, call 911 and bring them back to the ED if: A.  The seizure lasts longer than 5 minutes.      B.  The patient doesn't wake shortly after the seizure or has new problems such as difficulty seeing, speaking or moving following the seizure C.  The patient was injured during the seizure D.  The patient has a temperature over 102 F (39C) E.  The patient vomited during the seizure and now is having trouble breathing  
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
## 2018-10-29 NOTE — Progress Notes (Signed)
Pt was received with 4 point soft restraint and a waist restrained . Rn noted pt was asleep  Woke up slightly restless but fell back to sleep restraint was discontinued as pt was calm and sleeping was d/c . RN will continue to monitor

## 2018-10-29 NOTE — Progress Notes (Signed)
Patient being discharged home.  Patient to be transported by his wife.  IV removed by patient with the catheter intact.  Discharge instructions and prescription information given to the patient who verbalized understanding.

## 2018-10-29 NOTE — Progress Notes (Signed)
Pt slept all night ,l wake up and asked for pain medication. medicated for pain x 2 with effect pt follow command  No acute distress remain on 1 :1  Sitter for safety. No seizure activity noted.  Did not void all night  Bladder scan done voided 450 cc when fully awake bladder scan repeated and noted 900  . Pt I/o cath  Using aseptic procedure 700 cc noted post void. Endorsed to am  RN  to continue to monitor pt if he voids...  Pt wife called, RN told to call MD in am for any update.

## 2018-10-29 NOTE — Progress Notes (Signed)
Patient has been seen by psych and has been deemed to be not a  imminent risk to self or others at present.  Patient does not meet criteria for psychiatric inpatient admission. Patient needs close outpatient follow up with psych as well as-- shortly after he was seen by psych he said he did not know who he was to the nurse.  When told he would have to be able to answer all orientation questions to go home, he answered w/o issue.
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
## 2018-10-29 NOTE — TOC Transition Note (Signed)
Transition of Care [facility]) - CM/SW Discharge Note   Patient Details  Name: Fouad Taul MRN: [id number] Date of Birth: 01/18/1981  [facility]) CM/SW Contact:  Pollie Friar, RN Phone Number: 10/29/2018, 4:47 PM   Clinical Narrative:    Pt discharging home with self care. Pt discharging on Vimpat. CM provided 30 day free card. Wife to provide transportation home.   Final next level of care: Home/Self Care Barriers to Discharge: No Barriers Identified   Patient Goals and CMS Choice        Discharge Placement                       Discharge Plan and Services   Discharge Planning Services: CM Consult                                 Social Determinants of Health (SDOH) Interventions     Readmission Risk Interventions No flowsheet data found.

## 2018-10-29 NOTE — Progress Notes (Signed)
CSW was informed by the MD that the IVC has been rescinded. CSW completed the Commitment of Change Form and had Dr. Eliseo Squires sign off.   CSW faxed the form to the [facility] office. CSW has placed the signed form on the patient's chart.   If CSW is needed again, please don't hesitate to consult again.   Domenic Schwab, MSW, [place]

## 2018-10-30 LAB — ANA: Anti Nuclear Antibody (ANA): NEGATIVE

## 2018-10-30 NOTE — Discharge Summary (Signed)
Physician Discharge Summary  Nathan GrosDaniel Mccarty ONG:[id number] DOB: Feb 07, 1981 DOA: 10/27/2018  [facility]  Admit date: 10/27/2018 Discharge date: 10/30/2018  Admitted From: home Discharge disposition: home   Recommendations for Outpatient Follow-Up:   1. Avoid drugs/alcohol 2. Seizure precautions 3. vimpat started for seizures   Discharge Diagnosis:   Principal Problem:   Seizure (HCC) Active Problems:   Tobacco abuse   Amphetamine and psychostimulant-induced psychosis with delusions (HCC)    Discharge Condition: Improved.  Diet recommendation: Low sodium, heart healthy.  Wound care: None.  Code status: Full.   History of Present Illness:   Nathan Mccarty  is a 38 y.o. male,h/o subdural hematoma x2 (nontraumatic ?), seizure do, TBI while in [employer], apparently presents due to generalized seizure.  Per his wife, they had taken his dog to the veterinarian and had witnessed generalized tonic clonic seizure x 10 minutes.  + incontinence.  + postictal state.  Pt has been on dilantin in the past with adverse reaction of hallucination, and also previously on keppra with good results.  Pt's last seizure about 1 year ago according to his wife. Pt is currently not taking any medication.  He states that he is using marijuana to treat his seizures.     Hospital Course by Problem:   Breakthrough seizures Continue Vimpat 100mg  BID Avoid adderall, illicit drugs and marijuana Neurology consult appreciated Seizure precuations   Amphetamine and psychostimulant-induced psychosis with delusions (HCC) -he was much calmer than day prior when he was extremely agitated and violent -encouraged cessation of illegal drugs Patient has been seen by psych and has been deemed to be not a  imminent risk to self or others at present. Patient does not meet criteria for psychiatric inpatient admission. Patient needs close outpatient follow up with psych  HTN -BP  improved on day of d/c, suspect was related to illegal drug use  Medical Consultants:   Neuro psych   Discharge Exam:   Vitals:   10/29/18 1139 10/29/18 1800  BP: (!) 120/97 120/82  Pulse: (!) 101 74  Resp: 19 16  Temp: 98.6 F (37 C)   SpO2: 98%    Vitals:   10/29/18 0406 10/29/18 0745 10/29/18 1139 10/29/18 1800  BP: (!) 154/95 (!) 153/92 (!) 120/97 120/82  Pulse: 85 75 (!) 101 74  Resp: 19 19 19 16   Temp: 98.2 F (36.8 C) 98.6 F (37 C) 98.6 F (37 C)   TempSrc:  Oral Oral   SpO2: 97% 97% 98%   Weight:      Height:        General exam: Appears calm , poor eye contact  The results of significant diagnostics from this hospitalization (including imaging, microbiology, ancillary and laboratory) are listed below for reference.     Procedures and Diagnostic Studies:   Dg Chest 1 View  Result Date: 10/28/2018 CLINICAL DATA:  Cough EXAM: CHEST  1 VIEW COMPARISON:  None. FINDINGS: The heart size and mediastinal contours are within normal limits. Both lungs are clear. The visualized skeletal structures are unremarkable. IMPRESSION: No active disease. Electronically Signed   By: Katherine Mantlehristopher  Green M.D.   On: 10/28/2018 01:44   Ct Head Wo Contrast  Result Date: 10/27/2018 CLINICAL DATA:  38 year old male with seizure. History of intracranial hemorrhage in March. EXAM: CT HEAD WITHOUT CONTRAST TECHNIQUE: Contiguous axial images were obtained from the base of the skull through the vertex without intravenous contrast. [facility] without  contrast 08/07/2018 FINDINGS: Brain: No acute intracranial hemorrhage identified today. No residual right occipital convexity blood identified. Stable cerebral volume. No ventriculomegaly. No midline shift, mass effect, or evidence of intracranial mass lesion. Gray-white matter differentiation is stable and within normal limits throughout the brain. No cortically based acute infarct identified. Chronic mega cisterna  magna suspected, normal variant. Vascular: No suspicious intracranial vascular hyperdensity. Skull: Previous bilateral superior convexity craniotomies. No acute osseous abnormality identified. Sinuses/Orbits: Visualized paranasal sinuses and mastoids are stable and well pneumatized. Other: Negative orbits. Chronic postoperative changes to the scalp. IMPRESSION: 1. No acute intracranial abnormality. 2. Resolved right occipital blood since March, negative noncontrast CT appearance of the brain today aside from prior bilateral craniotomies. Electronically Signed   By: Genevie Ann M.D.   On: 10/27/2018 15:58   Mr Brain Wo Contrast  Result Date: 10/28/2018 CLINICAL DATA:  History of seizures after BILATERAL subdural hematomas, now with recent recurrence of seizures. EXAM: MRI HEAD WITHOUT CONTRAST TECHNIQUE: Multiplanar, multiecho pulse sequences of the brain and surrounding structures were obtained without intravenous contrast. COMPARISON:  CT head 10/27/2018 is negative. FINDINGS: The patient, according to technologist notes, had a seizure during the MRI examination, and the study was terminated. Overall the examination is diagnostic. Brain: No acute infarction, hemorrhage, hydrocephalus, extra-axial collection or mass lesion. Slight premature for age cerebral and cerebellar atrophy. No significant white matter disease. Incidental retrocerebellar cisterna magna. Vascular: Flow voids are maintained. Skull and upper cervical spine: No skull base abnormality. BILATERAL burr holes for subdural evacuation, better evaluated on CT. Sinuses/Orbits: Negative. Other: None. IMPRESSION: Slight premature for age atrophy. No significant white matter disease. No acute intracranial findings. Electronically Signed   By: Staci Righter M.D.   On: 10/28/2018 11:26     Labs:   Basic Metabolic Panel: Recent Labs  Lab 10/27/18 1510  NA 138  K 3.9  CL 105  CO2 23  GLUCOSE 84  BUN 12  CREATININE 0.90  CALCIUM 9.1    GFR Estimated Creatinine Clearance: 144 mL/min (by C-G formula based on SCr of 0.9 mg/dL). Liver Function Tests: Recent Labs  Lab 10/27/18 1942  AST 24  ALT 14  ALKPHOS 52  BILITOT 0.8  PROT 7.3  ALBUMIN 4.1   No results for input(s): LIPASE, AMYLASE in the last 168 hours. Recent Labs  Lab 10/27/18 1942  AMMONIA 51*   Coagulation profile No results for input(s): INR, PROTIME in the last 168 hours.  CBC: Recent Labs  Lab 10/27/18 1510  WBC 7.2  HGB 14.9  HCT 44.1  MCV 88.2  PLT 210   Cardiac Enzymes: No results for input(s): CKTOTAL, CKMB, CKMBINDEX, TROPONINI in the last 168 hours. BNP: Invalid input(s): POCBNP CBG: No results for input(s): GLUCAP in the last 168 hours. D-Dimer No results for input(s): DDIMER in the last 72 hours. Hgb A1c No results for input(s): HGBA1C in the last 72 hours. Lipid Profile No results for input(s): CHOL, HDL, LDLCALC, TRIG, CHOLHDL, LDLDIRECT in the last 72 hours. Thyroid function studies Recent Labs    10/27/18 1942  TSH 0.616   Anemia work up Recent Labs    10/27/18 [place] 246   Microbiology Recent Results (from the past 240 hour(s))  SARS Coronavirus 2 (CEPHEID - Performed in [facility] hospital lab), Hosp Order     Status: None   Collection Time: 10/27/18  7:02 PM  Result Value Ref Range Status   SARS Coronavirus 2 NEGATIVE NEGATIVE Final    Comment: (  NOTE) If result is NEGATIVE SARS-CoV-2 target nucleic acids are NOT DETECTED. The SARS-CoV-2 RNA is generally detectable in upper and lower  respiratory specimens during the acute phase of infection. The lowest  concentration of SARS-CoV-2 viral copies this assay can detect is 250  copies / mL. A negative result does not preclude SARS-CoV-2 infection  and should not be used as the sole basis for treatment or other  patient management decisions.  A negative result may occur with  improper specimen collection / handling, submission of specimen other   than nasopharyngeal swab, presence of viral mutation(s) within the  areas targeted by this assay, and inadequate number of viral copies  (<250 copies / mL). A negative result must be combined with clinical  observations, patient history, and epidemiological information. If result is POSITIVE SARS-CoV-2 target nucleic acids are DETECTED. The SARS-CoV-2 RNA is generally detectable in upper and lower  respiratory specimens dur ing the acute phase of infection.  Positive  results are indicative of active infection with SARS-CoV-2.  Clinical  correlation with patient history and other diagnostic information is  necessary to determine patient infection status.  Positive results do  not rule out bacterial infection or co-infection with other viruses. If result is PRESUMPTIVE POSTIVE SARS-CoV-2 nucleic acids MAY BE PRESENT.   A presumptive positive result was obtained on the submitted specimen  and confirmed on repeat testing.  While 2019 novel coronavirus  (SARS-CoV-2) nucleic acids may be present in the submitted sample  additional confirmatory testing may be necessary for epidemiological  and / or clinical management purposes  to differentiate between  SARS-CoV-2 and other Sarbecovirus currently known to infect humans.  If clinically indicated additional testing with an alternate test  methodology [phone]([id number]) is advised. The SARS-CoV-2 RNA is generally  detectable in upper and lower respiratory sp ecimens during the acute  phase of infection. The expected result is Negative. Fact Sheet for Patients:  BoilerBrush.com.cyhttps://www.fda.gov/media/136312/download Fact Sheet for Healthcare Providers: https://pope.com/https://www.fda.gov/media/136313/download This test is not yet approved or cleared by the [place] FDA and has been authorized for detection and/or diagnosis of SARS-CoV-2 by FDA under an Emergency Use Authorization (EUA).  This EUA will remain in effect (meaning this test can be used) for the duration of  the COVID-19 declaration under Section 564(b)(1) of the Act, 21 U.S.C. section 360bbb-3(b)(1), unless the authorization is terminated or revoked sooner. Performed at [facility], [address] [place], [place] [address]      Discharge Instructions:   Discharge Instructions    Diet - low sodium heart healthy   Complete by:  As directed    Discharge instructions   Complete by:  As directed    Avoid adderall, illicit drugs and marijuana   Increase activity slowly   Complete by:  As directed      Allergies as of 10/29/2018      Reactions   Dilantin [phenytoin Sodium Extended] Other (See Comments)   Hallucination per wife   Penicillins    Did it involve swelling of the face/tongue/throat, SOB, or low BP? No Did it involve sudden or severe rash/hives, skin peeling, or any reaction on the inside of your mouth or nose? No Did you need to seek medical attention at a hospital or doctor's office? No When did it last happen? If all above answers are NO, may proceed with cephalosporin use.   Topamax [topiramate]    hallucinations   Toradol [ketorolac Tromethamine] Other (See Comments)      Medication List  STOP taking these medications   gabapentin 300 MG capsule Commonly known as:  NEURONTIN     TAKE these medications   Lacosamide 100 MG Tabs Take 1 tablet (100 mg total) by mouth 2 (two) times daily.      Follow-up Information    [facility]   Contact information: [address] [place] [address] [phone]            Time coordinating discharge: 35 min  Signed:  Joseph ArtJessica U Kabrea Seeney DO  [facility] 10/30/2018, 3:37 PM

## 2020-11-10 IMAGING — CT CT HEAD WITHOUT CONTRAST
4 series · 15 of 47 positions shown, 17 images · non-contrast
Comparison: [HOSPITAL] Liudmila Frankel CT without contrast
08/07/2018

CLINICAL DATA: 37-year-old male with seizure. History of
intracranial hemorrhage in [REDACTED].

EXAM:
CT HEAD WITHOUT CONTRAST
TECHNIQUE: Contiguous axial images were obtained from the base of the skull
through the vertex without intravenous contrast.

[Series 2: head w o · axial · 0.47mm/px · z∈[-45,+80]mm · 7 of 35 slices shown, 9 images]
[im 5/35  brain]
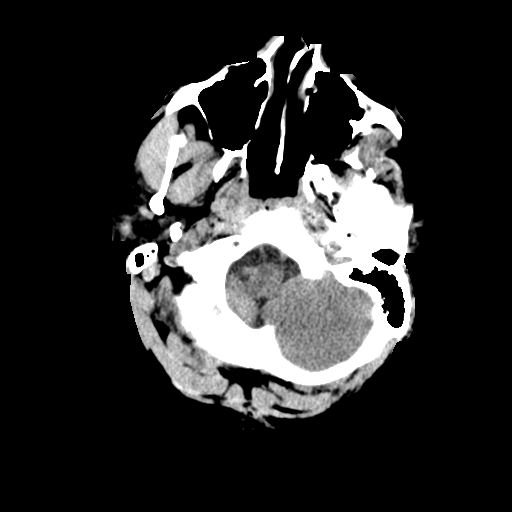
[im 5/35  bone]
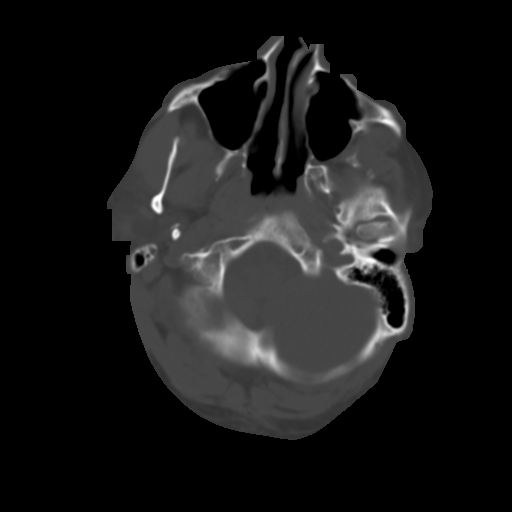
[im 9/35  brain]
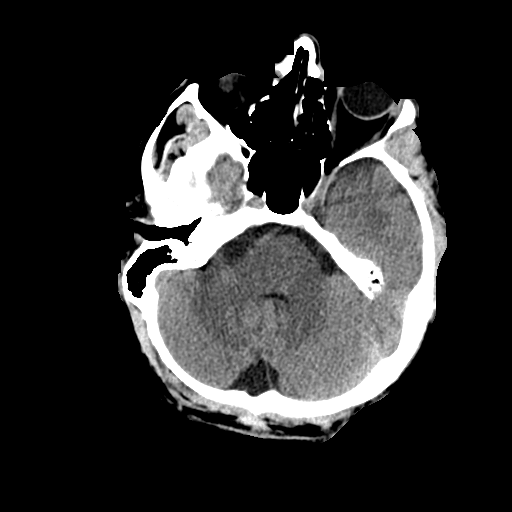
[im 13/35  brain]
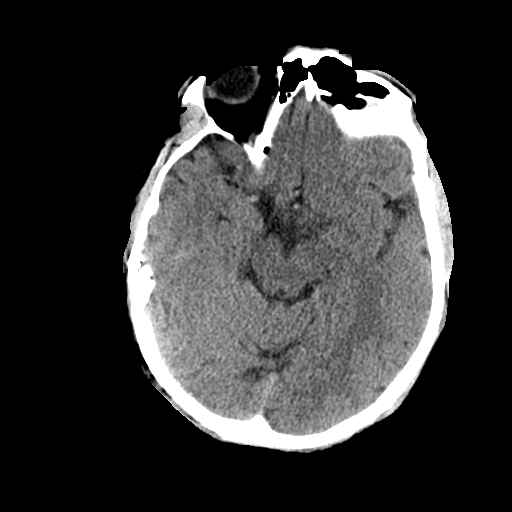
[im 18/35  brain]
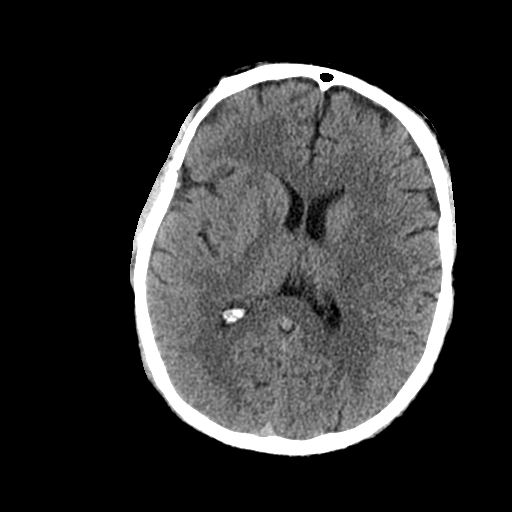
[im 22/35  brain]
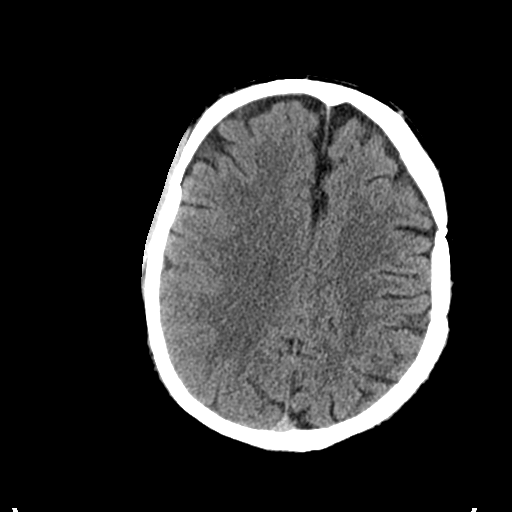
[im 22/35  bone]
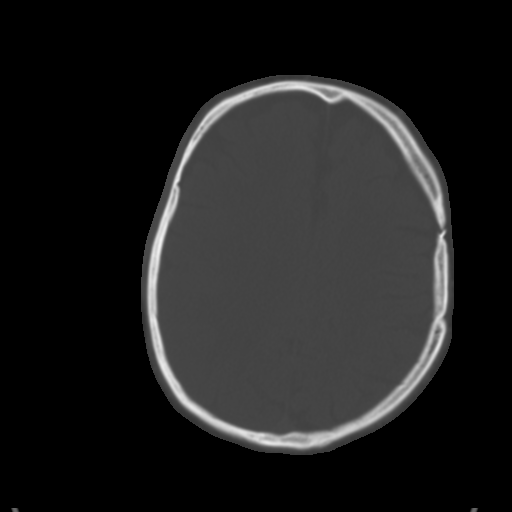
[im 26/35  brain]
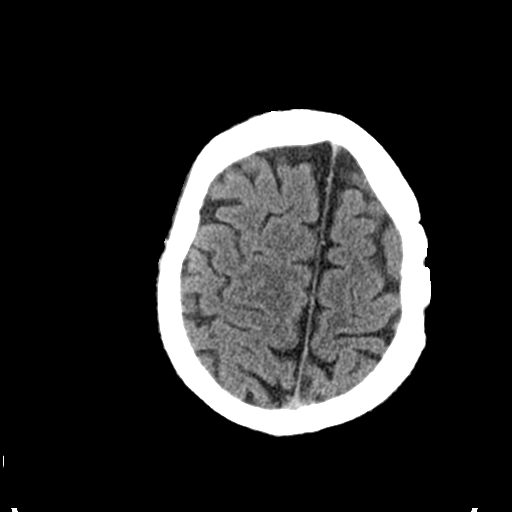
[im 30/35  brain]
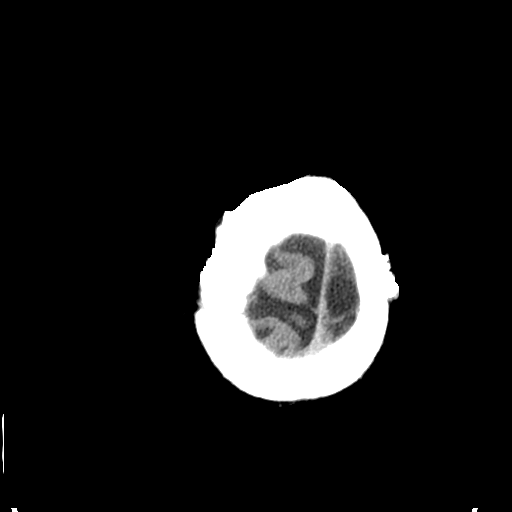

[Series 3: head bone · axial · 0.47mm/px · z∈[-49,-31]mm · 2 of 87 slices shown]
[im 9/87  bone]
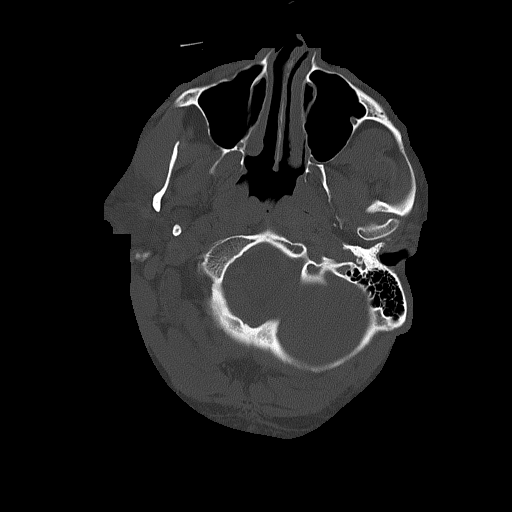
[im 18/87  bone]
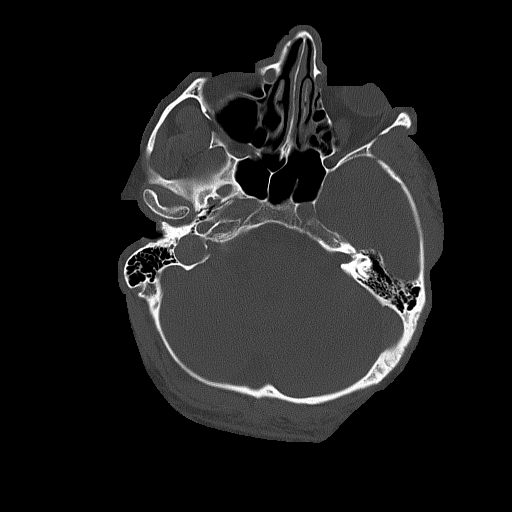

[Series 4: coronal soft · coronal · 0.36mm/px · 3 of 72 slices shown]
[im 24/72  brain]
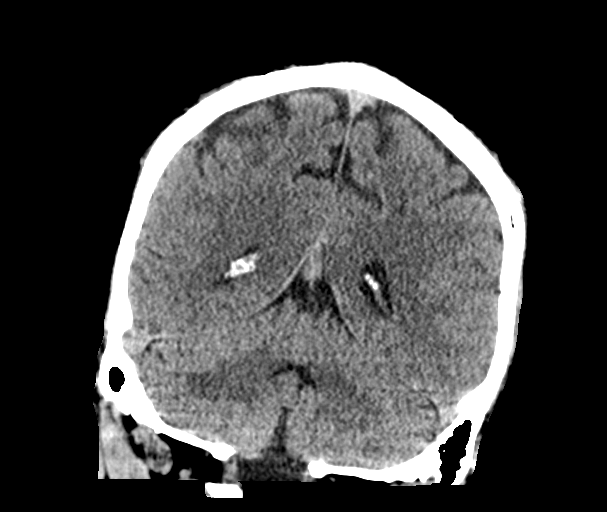
[im 32/72  brain]
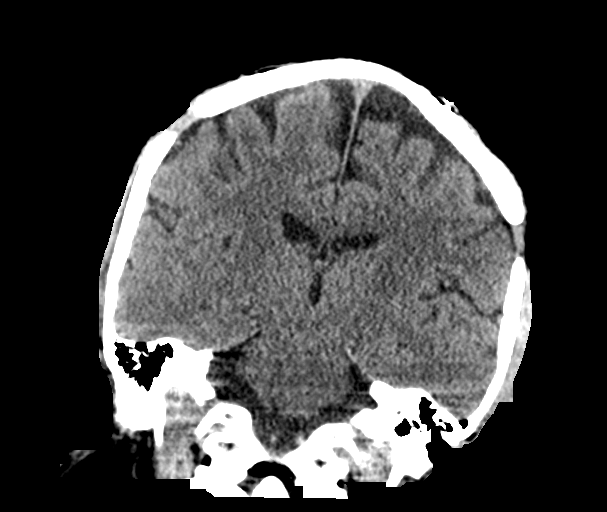
[im 40/72  brain]
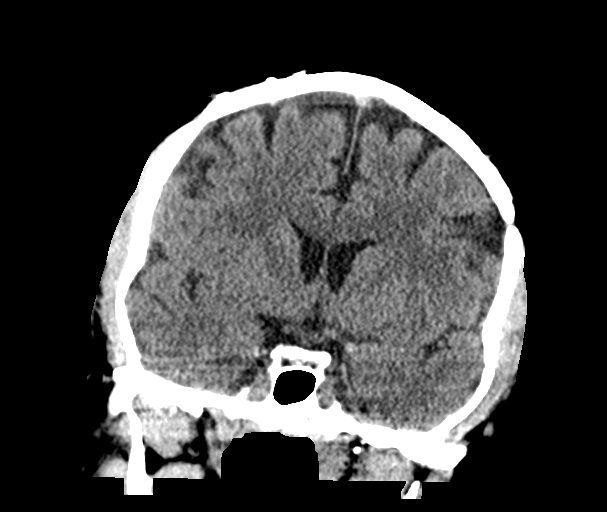

[Series 5: sagittal soft · sagittal · 0.34mm/px · 3 of 63 slices shown]
[im 25/63  brain]
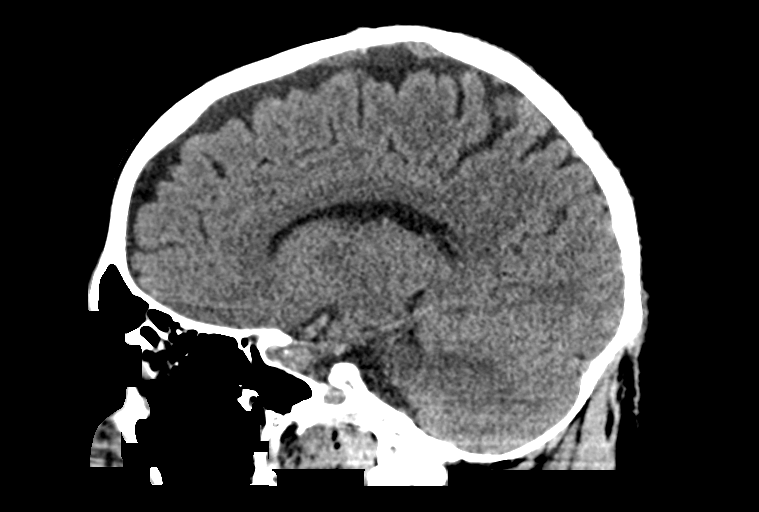
[im 32/63  brain]
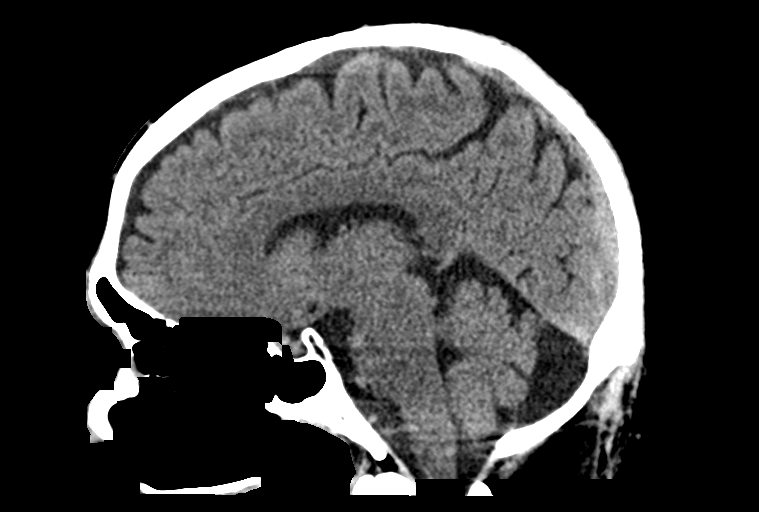
[im 39/63  brain]
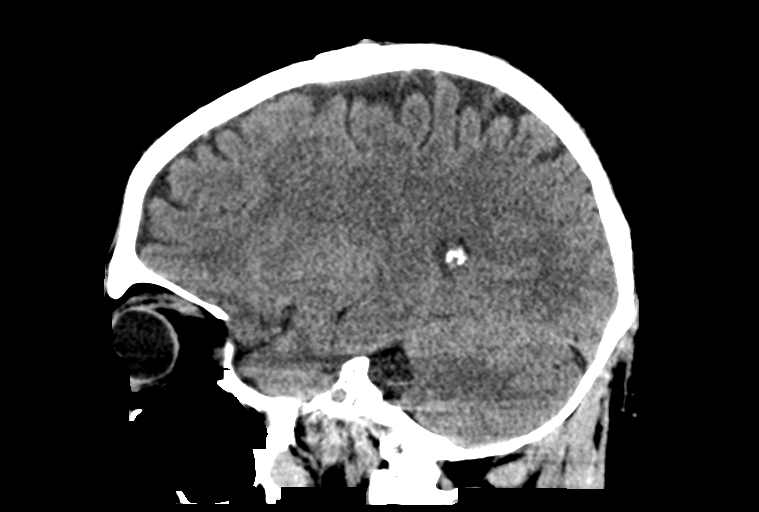

[15 of 47 positions shown; findings below may reference images not displayed]

FINDINGS: Brain: No acute intracranial hemorrhage identified today. No
residual right occipital convexity blood identified.

Stable cerebral volume. No ventriculomegaly. No midline shift, mass
effect, or evidence of intracranial mass lesion.

Gray-white matter differentiation is stable and within normal limits
throughout the brain. No cortically based acute infarct identified.
Chronic mega cisterna magna suspected, normal variant.

Vascular: No suspicious intracranial vascular hyperdensity.

Skull: Previous bilateral superior convexity craniotomies. No acute
osseous abnormality identified.

Sinuses/Orbits: Visualized paranasal sinuses and mastoids are stable
and well pneumatized.

Other: Negative orbits. Chronic postoperative changes to the scalp.
IMPRESSION: 1. No acute intracranial abnormality.
2. Resolved right occipital blood since [REDACTED], negative noncontrast
CT appearance of the brain today aside from prior bilateral
craniotomies.

## 2022-05-24 DEATH — deceased
